# Patient Record
Sex: Male | Born: 1960 | Race: White | Hispanic: No | Marital: Married | Smoking: Never smoker
Health system: Southern US, Community
[De-identification: ages and names within clinical notes are randomized; demographics above are authoritative.]

## PROBLEM LIST (undated history)

## (undated) DIAGNOSIS — M1990 Unspecified osteoarthritis, unspecified site: Secondary | ICD-10-CM

## (undated) DIAGNOSIS — I219 Acute myocardial infarction, unspecified: Secondary | ICD-10-CM

## (undated) DIAGNOSIS — I10 Essential (primary) hypertension: Secondary | ICD-10-CM

## (undated) DIAGNOSIS — E119 Type 2 diabetes mellitus without complications: Secondary | ICD-10-CM

## (undated) HISTORY — DX: Essential (primary) hypertension: I10

## (undated) HISTORY — PX: CORONARY ANGIOPLASTY WITH STENT PLACEMENT: SHX49

## (undated) HISTORY — DX: Type 2 diabetes mellitus without complications: E11.9

## (undated) HISTORY — PX: COLONOSCOPY W/ POLYPECTOMY: SHX1380

## (undated) HISTORY — DX: Unspecified osteoarthritis, unspecified site: M19.90

## (undated) HISTORY — DX: Acute myocardial infarction, unspecified: I21.9

## (undated) HISTORY — PX: PITUITARY SURGERY: SHX203

---

## 1999-07-04 ENCOUNTER — Emergency Department (HOSPITAL_COMMUNITY): Admission: EM | Admit: 1999-07-04 | Discharge: 1999-07-04 | Payer: Self-pay

## 2000-09-16 ENCOUNTER — Inpatient Hospital Stay (HOSPITAL_COMMUNITY): Admission: EM | Admit: 2000-09-16 | Discharge: 2000-09-19 | Payer: Self-pay | Admitting: Emergency Medicine

## 2000-09-16 ENCOUNTER — Encounter: Payer: Self-pay | Admitting: Emergency Medicine

## 2000-09-20 ENCOUNTER — Inpatient Hospital Stay (HOSPITAL_COMMUNITY): Admission: EM | Admit: 2000-09-20 | Discharge: 2000-09-22 | Payer: Self-pay

## 2004-07-15 ENCOUNTER — Inpatient Hospital Stay (HOSPITAL_COMMUNITY): Admission: EM | Admit: 2004-07-15 | Discharge: 2004-07-17 | Payer: Self-pay | Admitting: Emergency Medicine

## 2004-07-15 ENCOUNTER — Ambulatory Visit: Payer: Self-pay | Admitting: Pulmonary Disease

## 2004-07-16 ENCOUNTER — Encounter: Payer: Self-pay | Admitting: Cardiology

## 2004-08-03 ENCOUNTER — Ambulatory Visit: Payer: Self-pay | Admitting: Pulmonary Disease

## 2004-10-01 ENCOUNTER — Ambulatory Visit: Payer: Self-pay | Admitting: Critical Care Medicine

## 2004-10-29 ENCOUNTER — Ambulatory Visit: Payer: Self-pay | Admitting: Pulmonary Disease

## 2004-12-28 ENCOUNTER — Ambulatory Visit: Payer: Self-pay | Admitting: Pulmonary Disease

## 2004-12-30 ENCOUNTER — Emergency Department (HOSPITAL_COMMUNITY): Admission: EM | Admit: 2004-12-30 | Discharge: 2004-12-30 | Payer: Self-pay | Admitting: Emergency Medicine

## 2004-12-31 ENCOUNTER — Emergency Department (HOSPITAL_COMMUNITY): Admission: EM | Admit: 2004-12-31 | Discharge: 2004-12-31 | Payer: Self-pay | Admitting: Emergency Medicine

## 2005-01-19 ENCOUNTER — Emergency Department (HOSPITAL_COMMUNITY): Admission: EM | Admit: 2005-01-19 | Discharge: 2005-01-19 | Payer: Self-pay | Admitting: Emergency Medicine

## 2005-03-17 ENCOUNTER — Ambulatory Visit (HOSPITAL_BASED_OUTPATIENT_CLINIC_OR_DEPARTMENT_OTHER): Admission: RE | Admit: 2005-03-17 | Discharge: 2005-03-17 | Payer: Self-pay | Admitting: Pulmonary Disease

## 2005-03-24 ENCOUNTER — Ambulatory Visit: Payer: Self-pay | Admitting: Pulmonary Disease

## 2006-09-30 ENCOUNTER — Inpatient Hospital Stay (HOSPITAL_COMMUNITY): Admission: EM | Admit: 2006-09-30 | Discharge: 2006-09-30 | Payer: Self-pay | Admitting: Emergency Medicine

## 2006-09-30 ENCOUNTER — Ambulatory Visit: Payer: Self-pay | Admitting: *Deleted

## 2006-10-11 ENCOUNTER — Ambulatory Visit: Payer: Self-pay | Admitting: Cardiology

## 2010-01-30 ENCOUNTER — Observation Stay (HOSPITAL_COMMUNITY): Admission: EM | Admit: 2010-01-30 | Discharge: 2010-01-31 | Payer: Self-pay | Admitting: Emergency Medicine

## 2010-10-11 LAB — COMPREHENSIVE METABOLIC PANEL
ALT: 34 U/L (ref 0–53)
AST: 27 U/L (ref 0–37)
Calcium: 9.2 mg/dL (ref 8.4–10.5)
Creatinine, Ser: 0.99 mg/dL (ref 0.4–1.5)
GFR calc Af Amer: >60 mL/min (ref >60)
Sodium: 134 mEq/L — ABNORMAL LOW (ref 135–145)
Total Bilirubin: 0.6 mg/dL (ref 0.3–1.2)

## 2010-10-11 LAB — LIPID PANEL
LDL Cholesterol: 136 mg/dL — ABNORMAL HIGH (ref 0–99)
VLDL: 78 mg/dL — ABNORMAL HIGH (ref 0–40)

## 2010-10-11 LAB — BASIC METABOLIC PANEL
Chloride: 101 mEq/L (ref 96–112)
GFR calc Af Amer: >60 mL/min (ref >60)
GFR calc non Af Amer: >60 mL/min (ref >60)
Glucose, Bld: 225 mg/dL — ABNORMAL HIGH (ref 70–99)
Potassium: 3.5 mEq/L (ref 3.5–5.1)
Sodium: 137 mEq/L (ref 135–145)

## 2010-10-11 LAB — PROTIME-INR: INR: 0.94 (ref 0.00–1.49)

## 2010-10-11 LAB — CK TOTAL AND CKMB (NOT AT ARMC)
CK, MB: 2.1 ng/mL (ref 0.3–4.0)
CK, MB: 2.4 ng/mL (ref 0.3–4.0)
Relative Index: 1.2 (ref 0.0–2.5)
Relative Index: INVALID (ref 0.0–2.5)
Total CK: 107 U/L (ref 7–232)

## 2010-10-11 LAB — GLUCOSE, CAPILLARY: Glucose-Capillary: 350 mg/dL — ABNORMAL HIGH (ref 70–99)

## 2010-10-11 LAB — POCT CARDIAC MARKERS
CKMB, poc: 2.4 ng/mL (ref 1.0–8.0)
Myoglobin, poc: 75.4 ng/mL (ref 12–200)

## 2010-10-11 LAB — DIFFERENTIAL
Basophils Absolute: 0 10*3/uL (ref 0.0–0.1)
Eosinophils Absolute: 0.2 10*3/uL (ref 0.0–0.7)
Neutrophils Relative %: 75 % (ref 43–77)

## 2010-10-11 LAB — TROPONIN I: Troponin I: <0.01 ng/mL (ref 0.00–0.06)

## 2010-10-11 LAB — CBC
RBC: 4.95 MIL/uL (ref 4.22–5.81)
RDW: 11.9 % (ref 11.5–15.5)
WBC: 10.1 10*3/uL (ref 4.0–10.5)

## 2010-10-11 LAB — HEMOGLOBIN A1C
Hgb A1c MFr Bld: 12 % — ABNORMAL HIGH (ref <5.7)
Mean Plasma Glucose: 298 mg/dL — ABNORMAL HIGH (ref <117)

## 2010-10-11 LAB — APTT: aPTT: 25 seconds (ref 24–37)

## 2010-12-11 NOTE — H&P (Signed)
NAMENARADA, UZZLE NO.:  [id number]   MEDICAL RECORD NO.:  [id number]          PATIENT TYPE:  EMS   LOCATION:  MAJO                         FACILITY:  [facility]   PHYSICIAN:  Rod Holler, MD     DATE OF BIRTH:  [birth year]   DATE OF PROCEDURE:  DATE OF DISCHARGE:                    STAT - MUST CHANGE TO CORRECT WORK TYPE   CHIEF COMPLAINT:  Chest pain.   HISTORY OF PRESENT ILLNESS:  Mr. Carreiro is a 50 year old male who presents  to the emergency department with the complaint of chest pain.  The  patient chest pain started while he was playing his guitar, described it  in his left chest with radiation to his left neck and his left upper  extremity.  There was associated nausea and shortness of breath.  The  pain lasted for about 30 minutes.  Currently, the patient is chest pain  free.  He had a similar episode in 2002 and he had a subsequent cardiac  catheterization without obstruction coronary artery disease.  In the  emergency department, the patient was given Zofran and morphine.  He has  had no recent PND or orthopnea, no recent lower extremity swelling, no  syncope or presyncope.  No palpitations.   PAST MEDICAL HISTORY:  1. Obstructive sleep apnea.  2. Hypertension.  3. GERD.  4. Cardiac catheterization in 2002 with nonobstructive coronary artery      disease.  5. Hyperlipidemia.   MEDICATIONS:  1. Prevacid 30 mg p.o. daily.  2. Hyzaar 50/12.5 mg one tab p.o. daily.   ALLERGIES:  .  ASPIRIN.   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   FAMILY HISTORY:  Brother with history of MI in his 9s.  Father died  secondary to an MI in his 81s.   REVIEW OF SYSTEMS:  All systems are reviewed in detail.  Negative except  as noted in the history of present illness.   PHYSICAL EXAMINATION:  VITAL SIGNS:  Temperature 97, blood pressure  124/72, heart rate 86, respirations 17.  Oxygen saturation 97% on room  air.  GENERAL:   Slightly obese, alert and oriented x3, in no apparent  distress.  HEENT:  Atraumatic, normocephalic.  Pupils are equal, round and reactive  to light.  Extraocular movements intact.  Oropharynx clear.  NECK:  Supple.  No adenopathy.  No JVD.  No carotid bruits.  CHEST:  Lungs clear to auscultation bilaterally with equal bilateral  breath sounds.  CARDIOVASCULAR:  Regular rhythm.  Normal rate.  Normal S1 and S2.  No  murmurs, rubs or gallops.  ABDOMEN:  Soft, nontender, nondistended.  Active bowel sounds.  EXTREMITIES:  No clubbing or cyanosis.  NEUROLOGIC:  No focal deficits.   LABORATORY DATA:  CK-MB 3.3 and 3.5.  Troponin less than 0.05, less than  0.05.  Sodium 139, potassium 3.8, chloride 105, bicarb 27, BUN 18,  creatinine 0.9, glucose 153.  White blood cell count 13.4, hematocrit  44.3, platelet count 315. EKG shows normal sinus rhythm; otherwise  normal.   IMPRESSION AND PLAN:  A 50 year old  male with cardiovascular risk  factors who presents with chest pain.   PLAN:  1. Cardiovascular:  Admit the patient to a telemetry bed.  Rule out      serial cardiac enzymes.  No aspirin given his allergy.  Continue      Plavix, Hyzaar, Lopressor.  Hold statin given his intolerance to      statins in the past.  Check a lipid panel.  Daily EKGs.  No      anticoagulation at this time.  2. Pulmonary:  Follow up chest x-ray.  3. Fluids, electrolytes, and nutrition:  NPO.  4. Hematologic:  Coagulation panel in the morning.  5. Endocrine:  Sliding scale insulin.      Rod Holler, MD  Electronically Signed     TRK/MEDQ  D:  09/30/2006  T:  09/30/2006  Job:  [id number]

## 2010-12-11 NOTE — Discharge Summary (Signed)
[place]. [facility]  Patient:    [name], [name]                          MRN: [id number] Adm. Date:  [id number] Attending:  Armanda Heritage Dictator:   Abelino Derrick, P.A.C. [facility]                           Discharge Summary  HISTORY OF PRESENT ILLNESS: [name] is a 50 year old male who was recently admitted to [facility] for chest pain.  He ruled out for an MI and was set up for an outpatient stress test, which was done in the office on September 20, 2000.  He exercised for eight minutes with a peak heart rate of 146, which is greater than 80%.  Images were notable for reversible defect in the inferior basilar wall.  He was seen by Dr. Chales Abrahams and admitted for cardiac catheterization and further evaluation.  HOSPITAL COURSE: This was done on September 21, 2000 and revealed a 50% proximal RCA, 50% PDA, normal LAD and circumflex with an EF of 65%.  Dr. Chales Abrahams plans to increase his medical therapy.  He increased the beta-blocker and added Lipitor and Altace.  The patient will be seen in two weeks by the P.A. and at that time his Altace can be titrated up.  He will follow up with Dr. Chales Abrahams in six weeks, with LFTs and a lipid profile to be done.  DISCHARGE MEDICATIONS:  1. Enteric-coated aspirin q.d.  2. Lopressor 100 mg b.i.d.  3. Lipitor 40 mg q.d.  4. Altace 2.5 mg q.d.  LABORATORY DATA: EKG shows sinus rhythm, no acute change.  Lipid profile shows a cholesterol of 238, triglyceride 240, HDL 31, LDL 159. TSH 1.59.  WBC 12.3, hemoglobin 16, hematocrit 47.8; platelet count 303,000. INR 1.1.  Sodium 139, potassium 3.6, BUN 18, creatinine 1.1.  SGOT and SGPT slightly elevated, with SGOT of 39 and SGPT of 56.  CKs are negative.  DISCHARGE CONDITION: Stable.  FOLLOW-UP: The patient is to follow up with the P.A. in two weeks and Dr. Chales Abrahams in six weeks.  We may also want to check a BMP and follow-up liver function tests in two weeks since Lipitor  is new and Altace is new.  DD: 09/21/00 TD:  09/22/00 Job: [id number]

## 2010-12-11 NOTE — Procedures (Signed)
NAMETYLIK, TREESE NO.:  [id number]   MEDICAL RECORD NO.:  [id number]          PATIENT TYPE:  OUT   LOCATION:  SLEEP CENTER                 FACILITY:  [facility]   PHYSICIAN:  Marcelyn Bruins, M.D. [facility] DATE OF BIRTH:  1960/12/17   DATE OF STUDY:  03/17/2005                              NOCTURNAL POLYSOMNOGRAM   REFERRING PHYSICIAN:  Danice Goltz, M.D. [facility].   DATE OF STUDY:  March 17, 2005.   INDICATION FOR STUDY:  Hypersomnia with sleep apnea. The patient returns for  pressure optimization. Epworth score: 12.   SLEEP ARCHITECTURE:  The patient had a total sleep time of 302 minutes with  decreased slow wave sleep and REM.  Sleep onset latency was normal and REM  onset was fairly short. Sleep architecture is only 72%.   IMPRESSION:  1.  Moderate control of previously documented obstructive sleep apnea with      BiPAP set on an inspiratory pressure of 16 and an expiratory pressure of      12. The patient was initially started on his large ResMed Ultra Mirage      full face mask that he uses at home, however, he was noted to have large      amounts of leaks. This was ultimately changed to a small Respironics      comfort gel nasal mask. There is no special notes in the technician      comments regarding mouth opening on this mask. The patient was started      on a C-PAP titration and got as high as 19 cm with some breakthrough      being noted. However, the patient could not tolerate this high-level of      pressure. He was then changed to BiPAP and gradually increased to an      inspiratory pressure of 16 and an expiratory pressure of 12 with no      breakthrough being noted, however, this was a very short trial of only      five minutes, and very unlikely to hold the patient given the fact that      he needed 19 cm of C-PAP to control his events prior. The pressure was      then decreased to a level of 14/10 because of concerns over central      apneas, however,  these were very rare, and not clinically significant.      At the pressure 14/10, the patient clearly had significant breakthrough      in events. At this point in time, I would initiate the patient on BiPAP      at a pressure of 16/12 because of his very poor tolerance. I would      gradually try to increase the expiratory pressure to 16-18.  2.  No clinically significant cardiac arrhythmias.  3.  Very large numbers of leg jerks with significant sleep disruption. These      persisted even at near optimal pressure settings. Clinical correlation      is suggested.           ______________________________  Marcelyn Bruins, M.D. [facility]  Diplomate, Biomedical engineer of Sleep  Medicine    KC/MEDQ  D:  03/24/2005 15:50:46  T:  03/24/2005 21:27:50  Job:  [id number]

## 2010-12-11 NOTE — H&P (Signed)
[place]. [facility]  Patient:    [name], [name]                          MRN: [id number] Adm. Date:  [id number] Attending:  Cathren Laine                         History and Physical  CHIEF COMPLAINT:  Chest pain.  HISTORY OF PRESENT ILLNESS:  The patient is a 50 year old white male who was stable until approximately four days ago when he fractured his right ankle and sustained trauma to the right chest wall area.  Two days ago he had a severe episode of right-sided chest pain described as sharp.  It was associated with nausea and diaphoresis.  Due to the intensity of the pain, he was evaluated by EMS and noted to have accelerated hypertension with readings in the 220/140 range.  He declined a transport to the emergency room, and his blood pressure improved, but never normalized.  Over the past two days, the patient has had anterior chest pain described as a tightness and squeezing, associated with shortness of breath and intermittent nausea.  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  Because of his persistent chest pain,  he presented to the emergency room for evaluation, where he was noted to be hypertensive with a blood pressure of 170/100.  An electrocardiogram was normal.  In the emergency room setting the patient was pain-free.  He is now admitted for further evaluation and treatment of his chest pain, and to exclude acute coronary insufficiency.  PAST MEDICAL HISTORY: 1. The patient has a history of labile hypertension.  He states his blood    pressure over the years has been high, but this has been associated    with acute illness.  He has never been diagnosed as having hypertension    and has not been treated for hypertension. 2. He has a long history of asthma which he states was aggravated by    gastroesophageal reflux disease.  This was treated aggressively,    and his  asthma resolved.  It has been quiescent for several years. 3. He is a former user of tobacco, but discontinued about two years ago. 4. He also states that he has a history of "severe" sleep apnea.  He has    been intolerant to CPAP in the past.  Due to a lack of insurance    coverage, although the ENT surgery has been offered, but not pursued. 5. He states he has been hospitalized only on a single occasion for either    asthma or what sounds like laryngospasm approximately four years ago.  CURRENT MEDICATIONS:  Albuterol p.r.n. meter dose inhaler.  SOCIAL HISTORY:  The patient is divorced approximately two years ago.  He works as a Academic librarian.  FAMILY HISTORY:  Father died at age 92.  He had an extensive cardiac history with his first myocardial infarction at age 86.  Also had diabetes mellitus. Mother age 33 has diabetes mellitus.  His maternal grandfather had a myocardial infarction at age 47.  One brother has diabetes mellitus and a sister is living and well.  REVIEW OF SYSTEMS:  Otherwise really noncontributory.  Approximately five weeks ago he sustained trauma to his right hand with some tendon damage and possibly a fracture to a bone in his  hand.  As mentioned, he sustained a severe sprain and apparent hairline fracture to his right ankle recently.  PHYSICAL EXAMINATION:  VITAL SIGNS:  Blood pressure 170/100, pulse 82.  GENERAL:  A well-developed, healthy-appearing white male.  He was moderately overweight, in no acute distress.  SKIN:  Warm and dry.  HEENT:  Normal pupillary responses.  Conjunctivae clear.  ENT normal.  He did have a redundant soft palate and a large uvula, with a very small opening of his oropharynx.  NECK:  Short and stocky.  No adenopathy or bruits appreciated.  CHEST:  Clear, no bronchospasm.  CARDIOVASCULAR:  S1, S2 normal.  No murmurs, gallops, or ectopics.  ABDOMEN:  Soft, nontender.  No organomegaly.  Bowel sounds active. No chest wall  tenderness.  EXTREMITIES:  No edema.  Peripheral pulses intact.  His right hand and right ankle were in a brace.  IMPRESSION: 1. Chest pain, rule out myocardial infarction. 2. History of gastroesophageal reflux disease. 3. Recent right chest wall trauma. 4. Probable longstanding untreated hypertension.  DISPOSITION:  The patient will be admitted to the hospital.  Serial cardiac enzymes will be obtained.  He will be placed on IV heparin, daily aspirin, oral nitrates, and oral beta blocker therapy.  If he has any recurrence of his chest pain, will consider more aggressive management.  Cardiology will be consulted.  Laboratory studies, including serial cardiac enzymes, lipid profile, and blood sugars will all be reviewed. DD:  09/16/00 TD:  09/17/00 Job: [id number]

## 2010-12-11 NOTE — Cardiovascular Report (Signed)
[name], [name]                 ACCOUNT NO.:  [id number]   MEDICAL RECORD NO.:  [id number]          PATIENT TYPE:  INP   LOCATION:  6529                         FACILITY:  [facility]   PHYSICIAN:  Salvadore Farber, MD  DATE OF BIRTH:  [birth year]   DATE OF PROCEDURE:  DATE OF DISCHARGE:                            CARDIAC CATHETERIZATION   PROCEDURE:  1. Left heart catheterization.  2. Left ventriculography.  3. Coronary angiography.  4. Angio-Seal closure of the right common femoral arteriotomy site.   INDICATIONS:  [name] is a 50 year old gentleman with nonobstructive  coronary artery disease as diagnosed at cardiac catheterization in 2002.  He presents with having had an episode of left-sided chest discomfort  radiating to the neck, associated with shortness of breath and  diaphoresis while playing his guitar.  The pain lasted 30 minutes.  He  was admitted to hospital, where he was ruled out for a myocardial  infarction by serial enzymes and electrocardiograms.  Due to his known  coronary disease and a fairly classic symptoms,  he was referred for  diagnostic angiography.   PROCEDURAL TECHNIQUE:  Informed consent was obtained.  Under 1%  lidocaine local anesthesia, a 5-French sheath was placed in the right  common femoral artery using the modified Seldinger technique.  Diagnostic angiography and ventriculography were performed using JL-4,  JR-4, and pigtail catheters.  The arteriotomy was then closed using an  Angio-Seal device.  Complete hemostasis was obtained.  The patient was  then transferred to the holding room in stable condition, having  tolerated the procedure well.   COMPLICATIONS:  None.   FINDINGS:  1. LV:  116/8/20.  EF 60% without regional wall motion abnormality.  2. No aortic stenosis or mitral regurgitation.  3. Left main:  Angiographically normal.  4. LAD:  Moderate-sized vessel giving rise to a single large diagonal.      It has only minor luminal  irregularities.  5. Circumflex:  Moderate-sized vessel giving rise to a small first and      large second obtuse marginal.  It is angiographically normal.  6. RCA:  Large, dominant vessel.  The ostium of the PDA has a 60%      stenosis.  The posterior left ventricular branch has a 60% stenosis      proximally.  These are both unchanged compared with 2002.   IMPRESSION/RECOMMENDATIONS:  There has been no significant change in his  coronary anatomy compared with the films of 2002.  His lesions do not  appear to be severe enough to have caused the pain at rest.  I therefore  think it unlikely his symptoms are due to myocardial ischemia.  We will continue with secondary prevention efforts.  We will discharge him home today with instructions to follow up with his  primary care giver for any recurrent pain.      Salvadore Farber, MD  Electronically Signed     WED/MEDQ  D:  09/30/2006  T:  09/30/2006  Job:  [id number]   cc:   Luis Abed, MD, [facility]  [place]

## 2010-12-11 NOTE — Cardiovascular Report (Signed)
[place]. [facility]  Patient:    [name], [name]                          MRN: [id number] Proc. Date: 09/21/00 Adm. Date:  [id number] Disc. Date: [id number] Attending:  Veneda Melter CC:         Doylene Canning. Ladona Ridgel, M.D. [facility]             Titus Dubin. Alwyn Ren, M.D. [facility]                        Cardiac Catheterization  PROCEDURES PERFORMED: 1. Left heart catheterization. 2. Left ventriculogram. 3. Selective coronary angiography. 4. Attempted Perclose right femoral artery.  DIAGNOSES: 1. Single-vessel coronary artery disease. 2. Normal left ventricular systolic dysfunction.  HISTORY:  [name] is a 50 year old gentleman who presents with substernal chest discomfort and shortness of breath. The patient was recently admitted to the hospital and ruled out for an acute myocardial infarction. He underwent an outpatient Cardiolite stress test showing ischemia in the basilar inferior wall. He had recurrence of chest discomfort prompting readmission to the hospital. He presents now for cardiac catheterization.  TECHNIQUE:  Informed consent was obtained.  The patient was brought to the catheterization laboratory and a #6 French sheath was placed in the right femoral artery.  Selective angiography and left heart catheterization were then performed in the usual fashion using preformed #6 French Judkins catheters.  At the termination of the case, attempt was made to deploy a Perclose closure device and unfortunately, adequate hemostasis could not be obtained and manual pressure was applied until hemostasis was achieved. The catheters and sheaths were removed.  The patient tolerated the procedure well.  FINDINGS:  CORONARY ANGIOGRAPHY: 1. Left main coronary artery:  The left main trunk is a large    caliber vessel with mild irregularities of 10-20%. 2. Left anterior descending artery:  This is a medium caliber    vessel that provides a large first diagonal branch in the  proximal segment. There is mild diffuse disease in the    left anterior descending artery system of 20-30%. 3. Left circumflex coronary artery:  This is a medium caliber vessel    that provides a small first marginal branch in the proximal segment    and a larger second marginal branch in the midsection. There is    diffuse disease at 20-30% in the left circumflex system. 4. Right coronary artery:  The right coronary artery is dominant.    This is a large caliber vessel that provides the posterior descending    artery and two posterior ventricular branches in the terminal    segment. The right coronary artery has an ostial narrowing of    50% that extends into the proximal segment. There is mild diffuse    disease in the mid and distal right coronary artery with narrowings    not greater than 30%. The posterior descending artery has an ostial    narrowing of 50% with mild poststenotic dilatation. The first    posterior ventricular branch has a narrowing of 50% in the midsection.  LEFT VENTRICULOGRAPHY: 1. Normal end systolic and end diastolic dimensions. 2. Overall left ventricular function is well preserved. 3. Ejection fraction is greater than 65%. 4. No mitral regurgitation. 5. Left ventricular pressure is 150/0. 6. Aortic pressure is 150/100. 7. Left ventricular end diastolic pressure equals 14.  ASSESSMENT AND PLAN:  [name]  is a 50 year old gentleman with moderate single-vessel coronary artery disease. At this point continued medical therapy with aggressive risk factor modification, addition of HMG-CoA reductase and beta blocker to his medications, and possibly nitrates will be pursued. Should the patient have recurrent discomfort, consideration may be given towards percutaneous intervention. DD:  09/21/00 TD:  09/22/00 Job: [id number]

## 2010-12-11 NOTE — Discharge Summary (Signed)
NAMEJOHANTHAN, KNEELAND NO.:  [id number]   MEDICAL RECORD NO.:  [id number]          PATIENT TYPE:  INP   LOCATION:  3715                         FACILITY:  [facility]   PHYSICIAN:  Willa Rough, M.D.     DATE OF BIRTH:  Apr 20, 1961   DATE OF ADMISSION:  07/15/2004  DATE OF DISCHARGE:  07/17/2004                           DISCHARGE SUMMARY - REFERRING   SUMMARY OF HISTORY:  Mr. Arave is a 50 year old male who presented to the  emergency room with persistent dyspnea on exertion, orthopnea, and cough x6  weeks reminiscent of his hospitalization in 2002.  He was recently  prescribed prednisone Dosepak and a Z-Pak.  However, he has not obtained any  significant relief and thus his presentation.  He denies any associated  edema.   His history is notable for a positive outpatient Cardiolite, however,  catheterization showed nonobstructive coronary artery disease in February  2002 consisting of an EF of 65%, 10-20% left main, 20-30% LAD, 20-30%  circumflex, 50% RCA, 30% distal RCA, 50% PDA.  He also has a history of  asthma, hypertension, borderline diabetes, hyperlipidemia, and sleep apnea.  A chest x-ray on July 15, 2004 showed no acute disease.  Maxillofacial  CT showed chronic pansinusitis.   Admission hemoglobin was 16, and hematocrit 44.5, normal indices, platelets  309,000, WBC 12.8, PTT 28, PT 12.8, sodium 132, potassium 3.1, BUN 13,  creatinine 1.0, normal LFTs.  Hemoglobin A1C was elevated at 6.6.  CK-MB and  troponin was negative for myocardial infarction x3.  BNP was 30.0.  Fasting  lipid showed a total cholesterol of 170, triglycerides 195, HDL 34, LDL 97,  TSH 2.045.  Echocardiogram on July 16, 2004 revealed an ejection  fraction of 55-65% without wall motion abnormalities, mild aortic valve  calcification, mild aortic root dilatation, with mild fibrocalcific change  of the aortic root.   HOSPITAL COURSE:  Mr. Belling was admitted to 3700 with progressive  dyspnea on  exertion.  He was not clear as to the etiology of his symptoms.  Dr. Myrtis Ser  felt that if he needed cardiac evaluation we would not do a repeat  Cardiolite since this was abnormal in the past and that we would proceed  straight to cardiac catheterization.  Echocardiogram was performed.  Respiratory care became involved to assist with CPAP therapy.  Overnight,  the patient was stable with a BMP less than 30.  Dr. Myrtis Ser felt that  pulmonary was the issue.  Dr. Jayme Cloud was consulted on July 16, 2004.  Please refer to dictated note.  Dr. Jayme Cloud noted that the patient needs  evaluation of chronic sinus disease.  A sinus CT was performed as previously  described.  Foradil and Pulmicort were added and medications for GERD.  She  recommended discontinuing ACE inhibitor, consider ARBs if blood pressure  needed further control.  She did not feel PFTs were needed at this time  because he was too dyspneic but would recommend as an outpatient.  By  July 17, 2004, the patient stated he felt much better and his lungs were  clear to  examination.  Dr. Jayme Cloud recommended continuing Foradil 1 cap  inhaled b.i.d., Pulmicort 2 puffs b.i.d., albuterol p.r.n., Protonix 40 mg  b.i.d., to avoid ACE inhibitors.  Could take Mucinex over-the-counter.  She  also gave him a prescription for Avalox 400 mg daily for 10 days and  Nasonex.  Recommended to continue CPAP.  Dr. Myrtis Ser reviewed and felt that the  patient could be discharged home.  From a cardiac standpoint, Dr. Jayme Cloud  agreed.   DISCHARGE DIAGNOSES:  1.  Acute bronchitis flare secondary to sinusitis.  2.  Cough secondary to #1, and possible ACE inhibitor.  3.  Sinusitis with nasal congestion, history as previously.   DISPOSITION:  Dr. Jayme Cloud recommended the patient be discharged home on:  1.  Avalox 400 mg 1 tablet p.o. daily for 10 days.  2.  Foradil Aerolizer one capsule inhaled b.i.d.  3.  Pulmicort 2 puffs b.i.d.  4.  Nasonex  nasal spray.  5.  Mucinex DM over-the-counter 2 tablets b.i.d.  6.  Protonix 40 mg b.i.d.  7.  He was advised not to take his Zocor and enalapril.   FOLLOWUP:  He will see Dr. Jayme Cloud in the office on August 03, 2004 at 3:15  p.m.  He was also asked to follow up with his primary care physician for  followup of his borderline diabetes and his elevated hemoglobin A1C.  Dr.  Cliffton Asters will continue to monitor his lipid status.  Dr. Myrtis Ser recommended  rechecking his CK-MB after the Zocor washes out of his system.  It was noted  that during the hospitalization his CK total maximum was 604, with an MB of  8.0.  However, relative indexes were within normal limits.       EW/MEDQ  D:  07/17/2004  T:  07/18/2004  Job:  [id number]   cc:   Modesto Charon  [address]  [place]  [place] [address]  Fax: [phone]   Danice Goltz, M.D. [facility]

## 2010-12-11 NOTE — Discharge Summary (Signed)
[place]  Patient:    [name], [name]                          MRN: [id number] Adm. Date:  [id number] Disc. Date: [id number] Attending:  Rogelia Boga Dictator:   Janora Norlander, N.P. CC:         Doylene Canning. Ladona Ridgel, M.D. [facility]   Discharge Summary  ADMISSION DIAGNOSES: 1. Chest pain, rule out myocardial infarction. 2. History of gastroesophageal reflux disease. 3. Recent chest wall trauma. 4. Hypertension.  DISCHARGE DIAGNOSES: 1. Chest pain, questionable unstable angina. 2. Hyperlipidemia.  DISCHARGE MEDICATIONS: 1. Lopressor 50 mg one p.o. b.i.d. 2. Aspirin 325 mg, enteric coated, one p.o. q.d. 3. Protonix 40 mg one p.o. q.d. 4. Potassium chloride 20 mEq one p.o. q.d.  HOSPITAL COURSE:  This is a 50 year old white male who experienced two days of chest tightness aggravated by exertion.  His EKG was normal, troponin and CKs were normal, CBC was normal, his K was at 3.4, and white blood cells were at 12.0.  He was ruled out for an MI.  Cardiology was consulted.  He was observed by cardiology, and at this point they have decided that they will do a cardiolite stress test today at 1 p.m. as an outpatient.  PHYSICAL EXAMINATION:  VITAL SIGNS:  [name] has stable vital signs, although a blood pressure of 158/103 is noted.  He is afebrile.  He is at 97% O2 saturation on room air.  GENERAL APPEARANCE:  He is awake, alert, and oriented in the company of a child and friend.  He is ambulating without chest pain.  CARDIOVASCULAR:  His heart is at a regular rate.  CHEST:  His lungs are clear to auscultation.  EXTREMITIES:  His extremities are without edema.  CONDITION ON DISCHARGE:  He is deemed stable on discharge.  He understands that he will see Dr. Ladona Ridgel today at 1 oclock for his stress test.  DISCHARGE FOLLOWUP:  He will follow up with primary concerning his hyperlipidemia. DD:  09/19/00 TD:  09/20/00 Job:  [id number]

## 2010-12-11 NOTE — Assessment & Plan Note (Signed)
[facility]                            CARDIOLOGY OFFICE NOTE   ARLYN, BUERKLE                        MRN:          [id number]  DATE:10/11/2006                            DOB:          [birth year]    This is a patient of Dr. Myrtis Ser.  This is a 50 year old white male patient  who presented to the hospital with prolonged chest pain, arm pain and  diaphoresis.  He underwent cardiac catheterization on September 30, 2006  which showed 60% PDA and PLA, 20% RCA, no flow-limiting lesions.  EF was  normal without wall motion abnormality.  His anatomy has not changed  since 2002.  He did have elevated blood sugars in the hospital in the  140 to 152 range and hemoglobin A1C was supposed to be done, but I  cannot find that in the labs.  He was given a diabetic diet and  Glucometer and he says his blood sugars at home are around 115.  He has  no medical insurance and says he cannot afford too many doctors visits  or hospitalizations.  He states that he has had trouble with his  shoulder and back for a while, but is reluctant to have this looked into  because of his insurance situation.  He has not had any pain since he  has been in the hospital.   CURRENT MEDICATIONS:  1. CPAP.  2. Plavix 75 mg daily.  3. Cozaar 50 mg daily.  4. Prevacid 30 mg daily.  5. Hydrochlorothiazide 12.5 mg daily.  6. Metoprolol 25 mg b.i.d.   PHYSICAL EXAMINATION:  This is a pleasant 50 year old white male in no  acute distress.  Blood pressure  149/100, pulse 86.  Weight 257.  NECK:  Without JVD, HJR, bruit or thyroid enlargement.  LUNGS:  Clear anterior, posterior and lateral.  HEART:  Regular rate and rhythm at 86 beats per minute.  Normal S1 and  S1.  No murmur, rub, bruits, thrill or heave noted.  ABDOMEN:  Soft without organomegaly, masses, lesions or abnormal  tenderness.  EXTREMITIES:  Without  cyanosis, clubbing or edema.  Good distal pulses.   IMPRESSION:  1. Chest pain,  question etiology.  Cardiac catheterization revealed      non-obstructive coronary artery disease.  2. Obstructive sleep apnea.  3. Hypertension.  4. Gastroesophageal reflux disease.  5. Hyperlipidemia.  6. Family history of premature coronary artery disease in brother and      father.  7. Osteoarthritis.  8. Remote history of tobacco abuse.  9. Hyperglycemia, on diabetic diet.   PLAN:  At this time.  I will increase his hydrochlorothiazide for his  blood pressure to 25 mg a day and he will need to follow up with Dr.  Cliffton Asters concerning his blood pressure and diabetes.  He can follow up with  [place].      Jacolyn Reedy, PA-C  Electronically Signed      Madolyn Frieze. Jens Som, MD, [facility]  Electronically Signed   ML/MedQ  DD: 10/11/2006  DT: 10/11/2006  Job #: [id number]

## 2010-12-11 NOTE — H&P (Signed)
[name], [name] NO.:  [id number]   MEDICAL RECORD NO.:  [id number]          PATIENT TYPE:  EMS   LOCATION:  MINO                         FACILITY:  [facility]   PHYSICIAN:  Willa Rough, M.D.     DATE OF BIRTH:  September 28, 1960   DATE OF ADMISSION:  07/15/2004  DATE OF DISCHARGE:                                HISTORY & PHYSICAL   REFERRING PHYSICIAN:  Dr. Modesto Charon.   REASON FOR ADMISSION:  The patient is a 50 year old male with multiple  cardiac risk factors and a history of nonobstructive coronary artery disease  by a previous cardiac catheterization, February 2002, who now presents to  the emergency room for evaluation of refractory exertional dyspnea.   The patient reports a six week history of progressive shortness of breath  with minimal exertion, although he reports no clear cut anterior chest  discomfort with exertion, he does report that the exertional dyspnea is the  predominant symptom.  Additionally he does report orthopnea when lying  supine associated with wheezing and a nonproductive cough.  However, he does  have sleep apnea, is compliant with his CPAP, and denies paroxysmal  nocturnal dyspnea or significant pedal edema.  The patient has been treated  with two trials of prednisone taper and Z-Pak over these past six weeks with  no significant improvement in his symptoms.  He also took a nebulizer  treatment earlier today and uses this on an as needed basis.  The patient  does have a history of asthma.  When asked to compare to his symptoms which  preceded his catheterization in 2002, he noted similarity with respect to  the dyspnea but states that his chest pain at that time was acute.  This  appears to be a more insidious process with respect to the dyspnea.   ALLERGIES:  1.  ASPIRIN (facial edema, whelps).  2.  ALTACE (nausea).  3.  LIPITOR (myalgias).   MEDICATIONS PRIOR TO ADMISSION:  1.  Plavix 75 every day.  2.  Zocor 40 q.h.s.  3.   Hydrochlorothiazide 25 every day.  4.  Norvasc 10 every day.  5.  Enalapril 10 every day.  6.  Asmanex 0.22 mg q.h.s.  7.  Albuterol MDI two puffs p.r.n.  8.  CPAP (7-cmH20) q.h.s.  9.  Albuterol nebulizer four times daily as needed.   PAST MEDICAL HISTORY:  1.  Coronary artery disease.      1.  Cardiac catheterization, February 2002, (post-Cardiolite revealing          inferobasilar ischemia), 10-20% LMCA, 20-30% LAD, 20-30% FX, 50%          ostial, 30% distal RCA, 50% PDA, 50% PVA.  Normal left ventricular          function, EF 65%.  2.  Hypertension.  3.  Dyslipidemia.  4.  Borderline diabetes mellitus.  5.  Asthma.  6.  Sleep apnea.  7.  History of gastroesophageal reflux disease.      1.  Currently quiescent.   SOCIAL HISTORY:  The patient lives in [place] with his  wife.  He works as  a Academic librarian.  He has four children.  He has not smoked tobacco in  approximately ten years.  He drinks alcohol only on a rare occasion.  Denies  illicit drug use.   FAMILY HISTORY:  Mother, age 9, diabetes mellitus, status post carotid  endarterectomy.  Father deceased age 97 secondary to myocardial infarction,  status post CABG at age 42.  Brother, age 47, status post myocardial  infarction at age 39.   REVIEW OF SYSTEMS:  As noted per HPI.  Otherwise negative for any recent  fever, productive cough, chills, or upper or lower GI bleeding.   PHYSICAL EXAMINATION:  VITAL SIGNS:  Blood pressure 144/80, pulse 91,  regular respirations 18, temperature 98.8, SaO2 95% (room air).  GENERAL:  A 50 year old male, obese, no apparent distress.  HEENT:  Normocephalic, atraumatic.  NECK:  __________  .  No bruits.  Unable to assess JVD.  LUNGS:  Clear to auscultation all fields.  HEART:  Regular rate and rhythm, S1 S2.  No significant murmurs.  No rubs.  No gallops.  ABDOMEN:  Soft,  nontender.  Intact bowel sounds.  No bruits.  EXTREMITIES:  __________  femoral pulses without bruits.  Intact  distal  pulses with no significant pedal edema.  NEURO:  No focal deficit.   Electrocardiogram:  Normal sinus rhythm at 80 BPM, with normal axis, and no  ischemic changes.   IMPRESSION:  1.  Persistent exertional dyspnea.      1.  Question anginal equivalent.  2.  Persistent cough, orthopnea.  3.  History of nonobstructive coronary artery disease, normal left      ventricular function.      1.  Cardiac catheterization, February 2002.      2.  Abnormal Cardiolite, pre-cath.  4.  Hypertension.  5.  Borderline diabetes mellitus.  6.  Dyslipidemia.  7.  Asthma.  8.  Sleep apnea.  9.  History of tobacco.  10. Aspirin allergy.   PLAN:  1.  The patient will be admitted to telemetry for further observation and      evaluation.  2.  Serial cardiac markers will be cycled to exclude ischemia, in addition      to complete metabolic profile, CBC with differential, PT, fasting lipid,      TSH, and a hemoglobin A1c.  3.  A 2D echocardiogram has been ordered for the morning to exclude left      ventricular dysfunction as the etiology of the dyspnea.  4.  At this point the underling etiology for the dyspnea is not clear.  The      patient has had a persistent cough and reportedly has been exposed to a      wood stove in the past few weeks.  We will, therefore, request a      pulmonary evaluation in the morning for further assistance.  At present      we are still awaiting a portable chest x-ray.  The patient may need a      high resolution CT scan but we will defer this to pulmonary.  5.  Regarding an ischemic workup, we do not recommend a repeat Cardiolite      given that he had a previous abnormal test prior to his catheterization;      therefore, the patient may well need a repeat coronary angiogram prior      to discharge to exclude coronary artery disease progression.  6.  Regarding  his medications, he will be continued on his current home     regimen including Plavix and beta-blockers  will be deferred secondary to      asthma.   The patient was seen and examined in conjunction with Dr. Willa Rough.       GS/MEDQ  D:  07/15/2004  T:  07/16/2004  Job:  [id number]   cc:   Modesto Charon  [address]  [place]  [place] [address]  Fax: [phone]

## 2010-12-11 NOTE — Discharge Summary (Signed)
NAMEELIO, HADEN                 ACCOUNT NO.:  [id number]   MEDICAL RECORD NO.:  [id number]          PATIENT TYPE:  INP   LOCATION:  6529                         FACILITY:  [facility]   PHYSICIAN:  Salvadore Farber, MD  DATE OF BIRTH:  1961/02/05   DATE OF ADMISSION:  09/30/2006  DATE OF DISCHARGE:  09/30/2006                               DISCHARGE SUMMARY   PROCEDURES:  1. Cardiac catheterization.  2. Coronary arteriogram.  3. Left ventriculogram.   PRIMARY DIAGNOSES:  1. Chest pain, no critical coronary artery disease at cath.  2. Moderate coronary artery disease with 60% posterior descending      artery and posterolateral artery, 20% right coronary artery, no      flow-limiting lesions.  3. Obstructive sleep apnea.  4. Hypertension.  5. Gastroesophageal reflux disease.  6. Hyperlipidemia with a total cholesterol of 226, triglycerides 148,      HDL 34, LDL 162.  7. Hyperglycemia with a hemoglobin A1c pending at the time of      dictation.  8. ALLERGY TO ASPIRIN AND ADVIL WITH RASH.  9. Family history of premature coronary artery disease in brother and      father.  10.Remote history of tobacco use.  11.History of right knee pain, possibly secondary to osteoarthritis.   TIME OF DISCHARGE:  Thirty nine minutes.   HOSPITAL COURSE:  Mr. Hyland is a 50 year old male with a history of a 50%  RCA lesion by cath in 2002.  He had recurrent chest pain and came to the  hospital where he was admitted for further evaluation and treatment.   Mr. Meech' cardiac enzymes were negative.  He however had recurrent pain  and significant cardiac risk factors so he was brought to the cath lab.   The cardiac catheterization showed moderate but not flow-limiting  disease in the distal RCA system.  His EF was normal without wall motion  abnormalities.  Dr. Samule Ohm evaluated the films and felt that there was  no significant change in anatomy consistent with 2002 and that the  lesions were not severe  enough to cause rest pain.   Postcath Mr. Amedee had another episode of pain and was treated with pain  control medications.  Of note, he had elevated blood sugars at 153, 142  and 131.  A hemoglobin A1c is pending at the time of dictation.  Mr.  Reigle' cath site was without bruit or hematoma and he was considered  stable for discharge with outpatient followup arranged.   DISCHARGE INSTRUCTIONS:  His activity level is to be increased  gradually.  He is not to lift anything over 10 pounds for a week and not  to drive for 2 days.  He is to call our office for problems with the  cath site.  He has a follow-up appointment with the PA for Dr. Myrtis Ser on  October 11, 2006.  He is to follow up with Dr. Cliffton Asters as well.  He is  encouraged to go on a diabetic diet.   DISCHARGE MEDICATIONS:  1. Plavix 75 mg a day.  2.  Hyzaar 50/12.5 mg daily.  3. Prevacid 30 mg a day.  4. Metoprolol 25 mg b.i.d.  5. Darvocet p.r.n., 30 tablets given no refills.      Theodore Demark, PA-C      Salvadore Farber, MD  Electronically Signed    RB/MEDQ  D:  09/30/2006  T:  10/01/2006  Job:  [id number]   cc:   Rosalita Levan, [place] Dr. Cliffton Asters

## 2011-09-01 IMAGING — CT CT CHEST W/ CM
2 of 4 series · 15 of 36 positions shown, 18 images · IV contrast (APPLIED)
Comparison: None

CLINICAL DATA: Upper back pain, chest pain

CT CHEST WITH CONTRAST
TECHNIQUE: Multidetector CT imaging of the chest was performed
following the standard protocol during bolus administration of
intravenous contrast.
Contrast: 100 ml 7mnipaque-JWW IV

[Series 2: routine chest 5.0 st · axial · 0.79mm/px · z∈[+1178,+1448]mm · 12 of 60 slices shown, 15 images]
[im 3/60  mediastinal]
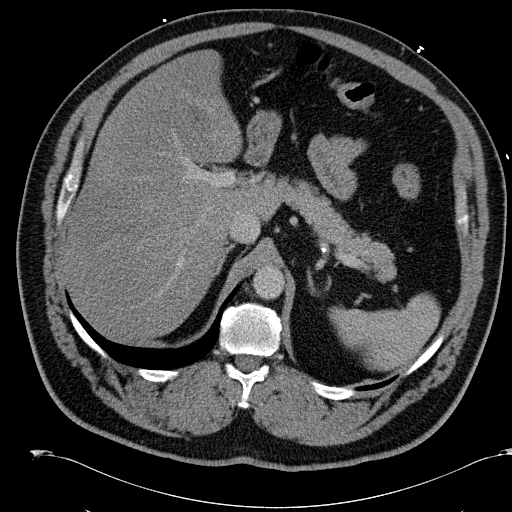
[im 3/60  lung]
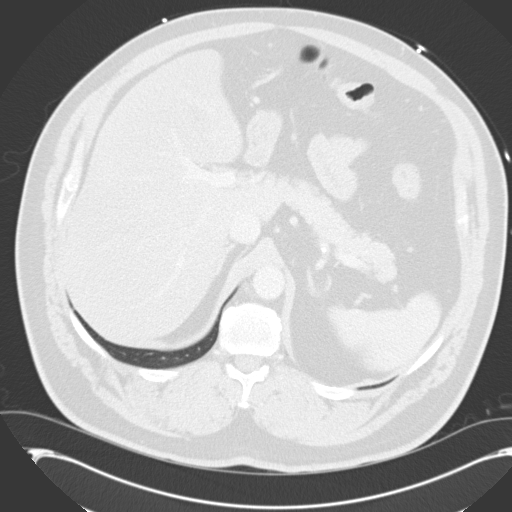
[im 9/60  lung]
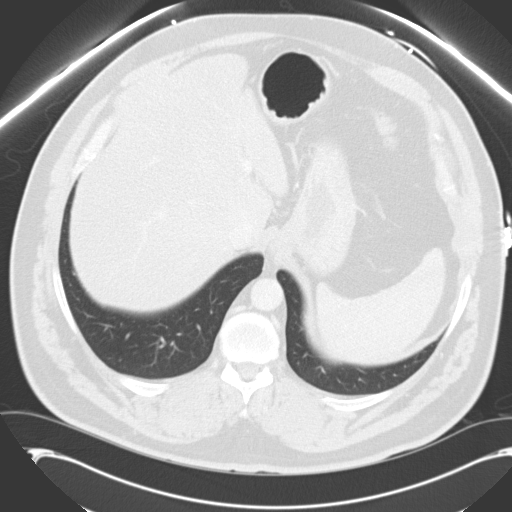
[im 15/60  lung]
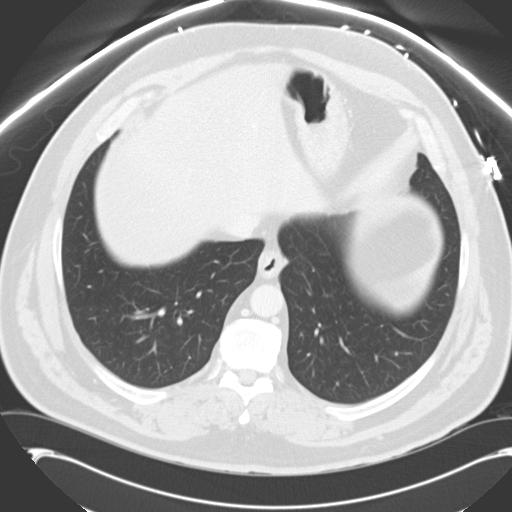
[im 17/60  lung]
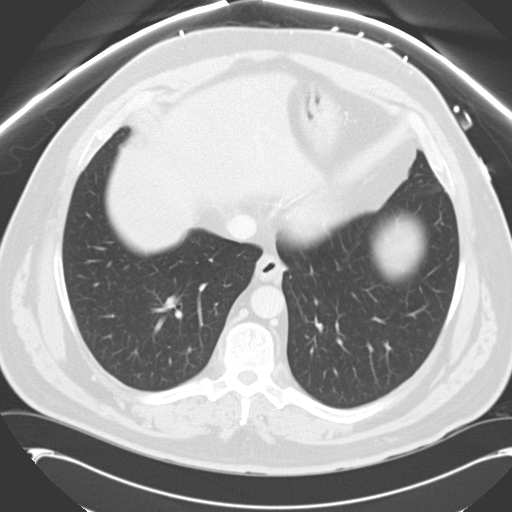
[im 23/60  mediastinal]
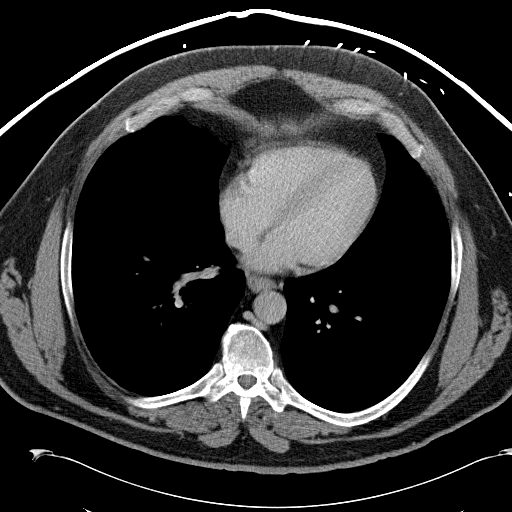
[im 23/60  lung]
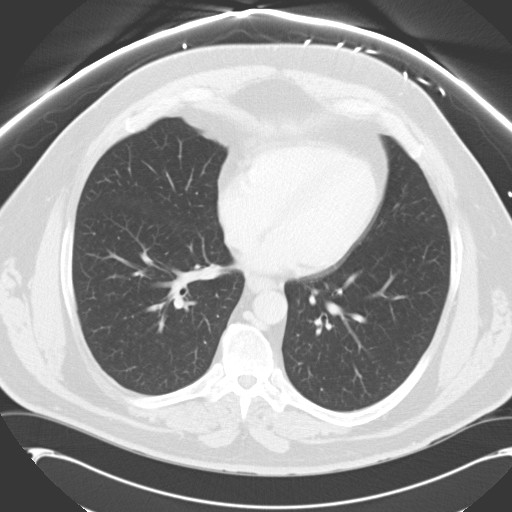
[im 29/60  lung]
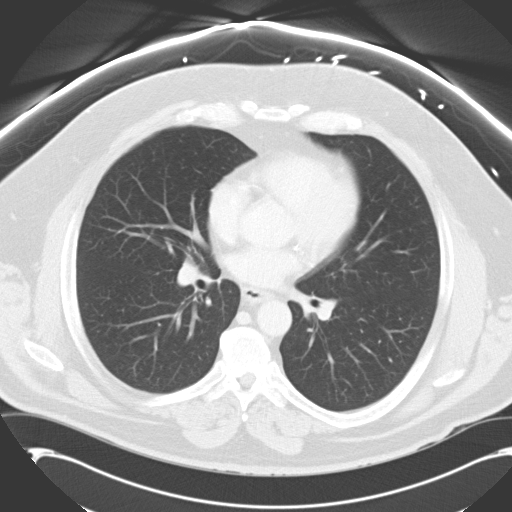
[im 31/60  lung]
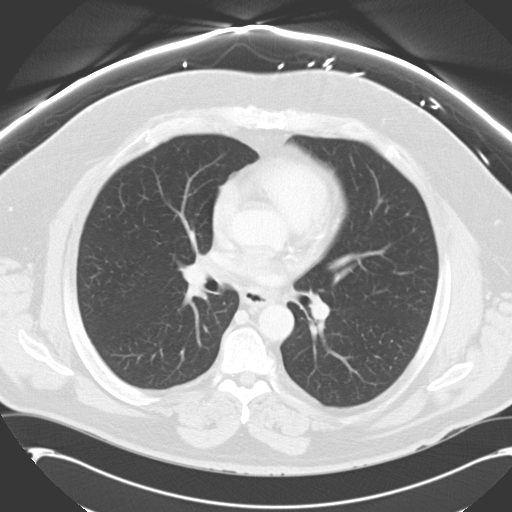
[im 37/60  lung]
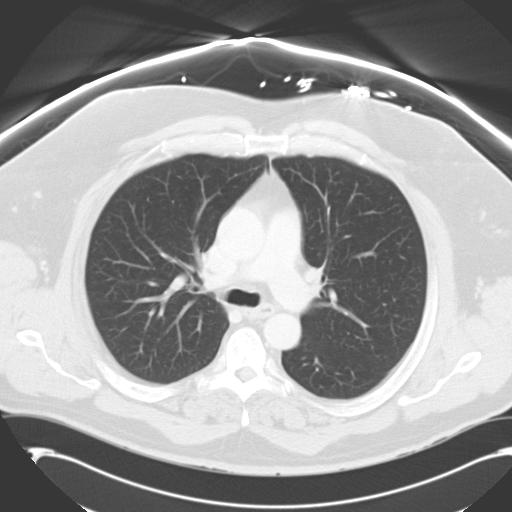
[im 43/60  mediastinal]
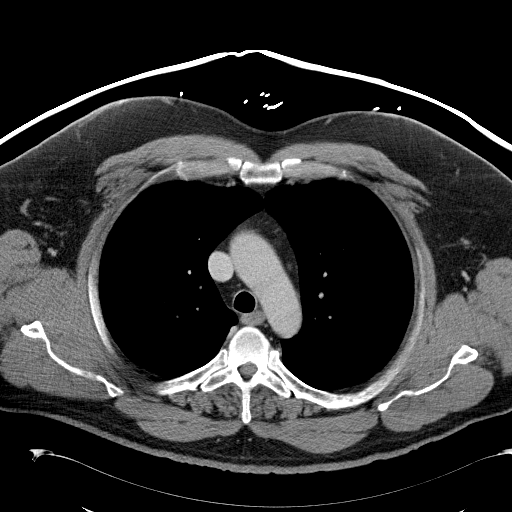
[im 43/60  lung]
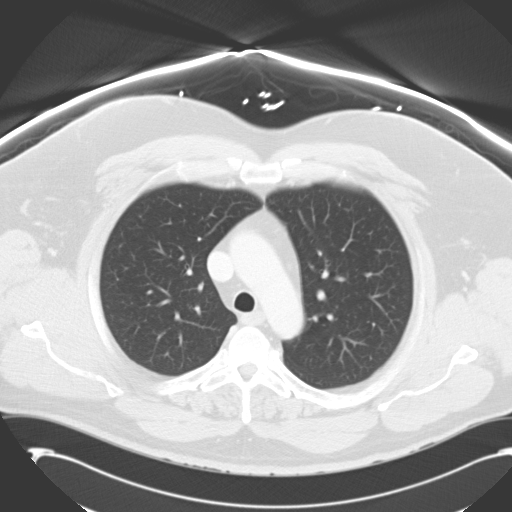
[im 45/60  lung]
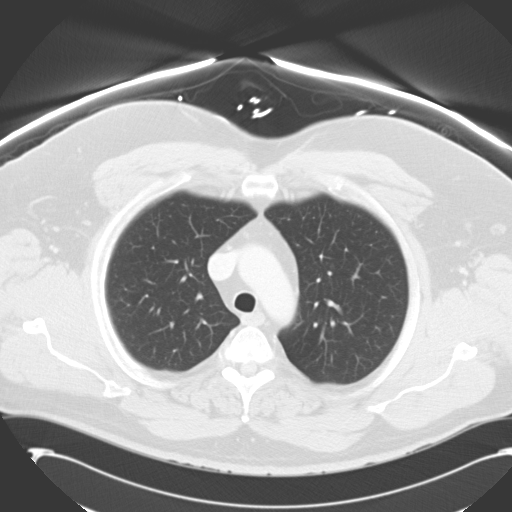
[im 51/60  lung]
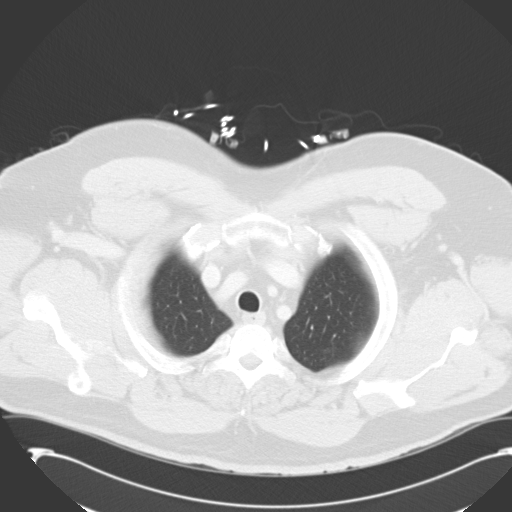
[im 57/60  lung]
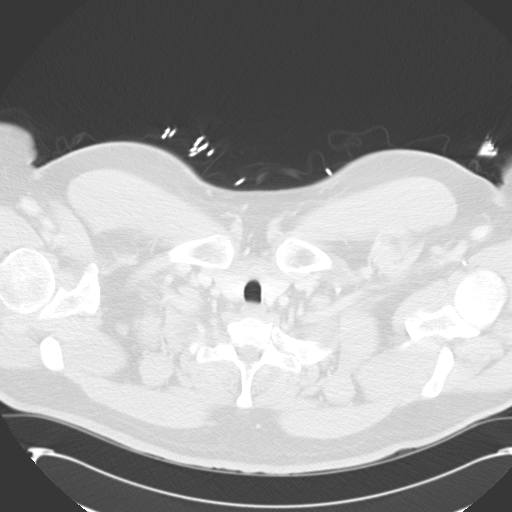

[Series 602: coronal · coronal · 0.79mm/px · 3 of 98 slices shown]
[im 20/98  lung]
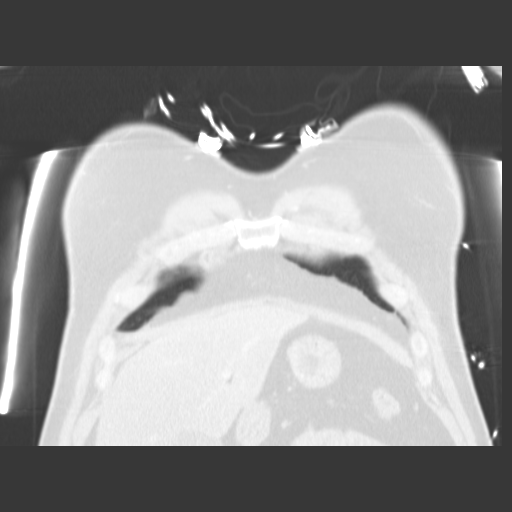
[im 39/98  lung]
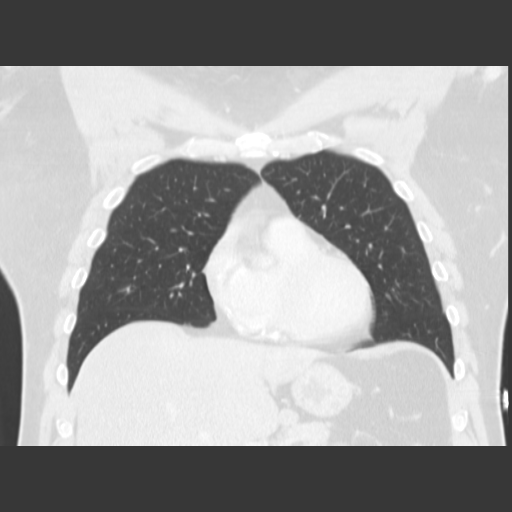
[im 59/98  lung]
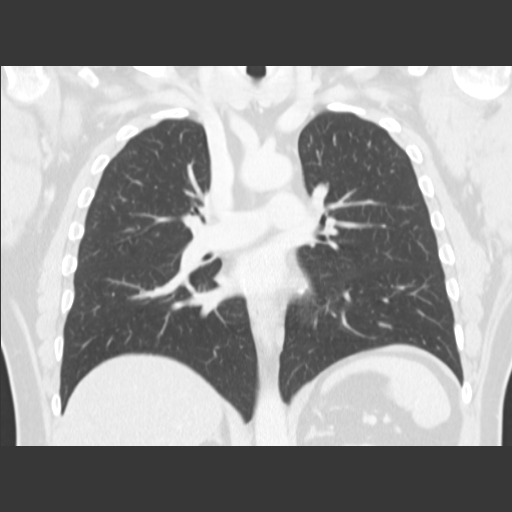

[15 of 36 positions shown; findings below may reference images not displayed]

FINDINGS: No pleural or pericardial effusion.  Patchy coronary and
aortic calcifications are evident.  Aorta is unremarkable.
Pulmonary arterial tree unremarkable although the scan was not
optimized for detection of pulmonary emboli.  No hilar or
mediastinal adenopathy.  Lungs are clear.  Mild fatty infiltration
of the liver.  Remainder visualized upper abdomen unremarkable.
Minimal spurring in the mid thoracic spine.
IMPRESSION: 1.  Coronary calcifications.
2.  Fatty liver.
3.  No acute thoracic process.

I telephoned the critical test results to Dr. Auntyjatty  at the time of
interpretation.

## 2015-06-04 ENCOUNTER — Encounter: Payer: Self-pay | Admitting: Internal Medicine

## 2015-07-03 ENCOUNTER — Encounter: Payer: Self-pay | Admitting: Pulmonary Disease

## 2022-05-03 ENCOUNTER — Inpatient Hospital Stay: Payer: HMO | Attending: Oncology | Admitting: Oncology

## 2022-05-03 ENCOUNTER — Encounter: Payer: Self-pay | Admitting: Oncology

## 2022-05-03 ENCOUNTER — Inpatient Hospital Stay: Payer: HMO

## 2022-05-03 ENCOUNTER — Other Ambulatory Visit: Payer: Self-pay | Admitting: Oncology

## 2022-05-03 DIAGNOSIS — D72829 Elevated white blood cell count, unspecified: Secondary | ICD-10-CM

## 2022-05-03 DIAGNOSIS — E119 Type 2 diabetes mellitus without complications: Secondary | ICD-10-CM

## 2022-05-03 DIAGNOSIS — I10 Essential (primary) hypertension: Secondary | ICD-10-CM | POA: Diagnosis not present

## 2022-05-03 DIAGNOSIS — Z801 Family history of malignant neoplasm of trachea, bronchus and lung: Secondary | ICD-10-CM | POA: Diagnosis not present

## 2022-05-03 LAB — CBC AND DIFFERENTIAL
HCT: 48 (ref 41–53)
Hemoglobin: 16.1 (ref 13.5–17.5)
Neutrophils Absolute: 9.29
Platelets: 330 10*3/uL (ref 150–400)
WBC: 12.9

## 2022-05-03 LAB — CBC: RBC: 5.11 (ref 3.87–5.11)

## 2022-05-03 NOTE — Progress Notes (Signed)
  [name]  [address] [place],  [place]  [address] [phone]  Clinic Day:  05/03/2022  Referring physician: Barnetta Chapel, NP   HISTORY OF PRESENT ILLNESS:  The patient is a 61 y.o. male  who I was asked to consult upon for leukocytosis.  A CBC in late September showed an elevated white count of 14.3.   PAST MEDICAL HISTORY:  No past medical history on file.  PAST SURGICAL HISTORY:  *** The histories are not reviewed yet. Please review them in the "History" navigator section and refresh this [place].  CURRENT MEDICATIONS:   No current outpatient medications on file.   No current facility-administered medications for this visit.    ALLERGIES:  Not on File  FAMILY HISTORY:  No family history on file.  SOCIAL HISTORY:   has no history on file for tobacco use, alcohol use, and drug use.  REVIEW OF SYSTEMS:  Review of Systems - Oncology   PHYSICAL EXAM:  There were no vitals taken for this visit. Wt Readings from Last 3 Encounters:  No data found for Wt   There is no height or weight on file to calculate BMI. Performance status (ECOG): {CHL ONC [id number] Physical Exam  LABS:      Latest Ref Rng & Units 01/31/2010    4:00 AM 01/30/2010   12:53 PM  CBC  WBC 4.0 - 10.5 K/uL 10.1  9.4   Hemoglobin 13.0 - 17.0 g/dL 15.8  16.6   Hematocrit 39.0 - 52.0 % 46.4  47.4   Platelets 150 - 400 K/uL 256  233       Latest Ref Rng & Units 01/31/2010    4:00 AM 01/30/2010   12:53 PM  CMP  Glucose 70 - 99 mg/dL 225  443   BUN 6 - 23 mg/dL 13  12   Creatinine 0.4 - 1.5 mg/dL 0.91  0.99   Sodium 135 - 145 mEq/L 137  134   Potassium 3.5 - 5.1 mEq/L 3.5  4.2   Chloride 96 - 112 mEq/L 101  100   CO2 19 - 32 mEq/L 27  25   Calcium 8.4 - 10.5 mg/dL 9.1  9.2   Total Protein 6.0 - 8.3 g/dL  7.2   Total Bilirubin 0.3 - 1.2 mg/dL  0.6   Alkaline Phos 39 - 117 U/L  96   AST 0 - 37 U/L  27   ALT 0 - 53 U/L  34      No results found  for: "CEA1", "CEA" / No results found for: "CEA1", "CEA" No results found for: "PSA1" No results found for: "MPN361" No results found for: "CAN125"  No results found for: "TOTALPROTELP", "ALBUMINELP", "A1GS", "A2GS", "BETS", "BETA2SER", "GAMS", "MSPIKE", "SPEI" No results found for: "TIBC", "FERRITIN", "IRONPCTSAT" No results found for: "LDH"  No results found for: "AFPTUMOR", "TOTALPROTELP", "ALBUMINELP", "A1GS", "A2GS", "BETS", "BETA2SER", "GAMS", "MSPIKE", "SPEI", "LDH", "CEA1", "CEA", "PSA1", "IGASERUM", "IGGSERUM", "IGMSERUM", "THGAB", "THYROGLB"  Review Flowsheet        No data to display          STUDIES:  No results found.   ASSESSMENT & PLAN:  A 61 y.o. male who I was asked to consult upon for *** .The patient understands all the plans discussed today and is in agreement with them.  I do appreciate Barnetta Chapel, NP for his new consult.   Jolita Haefner Macarthur Critchley, MD

## 2022-05-04 ENCOUNTER — Other Ambulatory Visit: Payer: Self-pay | Admitting: Oncology

## 2022-05-04 DIAGNOSIS — D72829 Elevated white blood cell count, unspecified: Secondary | ICD-10-CM | POA: Insufficient documentation

## 2022-05-04 MED ORDER — AMOXICILLIN-POT CLAVULANATE 875-125 MG PO TABS: 1.0000 | ORAL_TABLET | Freq: Two times a day (BID) | ORAL | 0 refills | Status: AC
